# Patient Record
Sex: Male | Born: 2000 | Hispanic: Yes | Marital: Single | Smoking: Never smoker
Health system: Southern US, Community
[De-identification: ages and names within clinical notes are randomized; demographics above are authoritative.]

## PROBLEM LIST (undated history)

## (undated) DIAGNOSIS — K259 Gastric ulcer, unspecified as acute or chronic, without hemorrhage or perforation: Secondary | ICD-10-CM

## (undated) DIAGNOSIS — E7800 Pure hypercholesterolemia, unspecified: Secondary | ICD-10-CM

---
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
## 2021-06-11 ENCOUNTER — Emergency Department
Admission: EM | Admit: 2021-06-11 | Discharge: 2021-06-11 | Disposition: A | Discharge status: Home / Self Care | Payer: Medicaid Other | Attending: Emergency Medicine | Admitting: Emergency Medicine

## 2021-06-11 ENCOUNTER — Encounter: Payer: Self-pay | Admitting: Emergency Medicine

## 2021-06-11 ENCOUNTER — Other Ambulatory Visit: Payer: Self-pay

## 2021-06-11 DIAGNOSIS — R7401 Elevation of levels of liver transaminase levels: Secondary | ICD-10-CM | POA: Diagnosis not present

## 2021-06-11 DIAGNOSIS — J029 Acute pharyngitis, unspecified: Secondary | ICD-10-CM | POA: Insufficient documentation

## 2021-06-11 DIAGNOSIS — D72829 Elevated white blood cell count, unspecified: Secondary | ICD-10-CM | POA: Diagnosis not present

## 2021-06-11 DIAGNOSIS — K920 Hematemesis: Secondary | ICD-10-CM | POA: Insufficient documentation

## 2021-06-11 DIAGNOSIS — R112 Nausea with vomiting, unspecified: Secondary | ICD-10-CM

## 2021-06-11 DIAGNOSIS — R197 Diarrhea, unspecified: Secondary | ICD-10-CM | POA: Diagnosis not present

## 2021-06-11 DIAGNOSIS — I10 Essential (primary) hypertension: Secondary | ICD-10-CM | POA: Diagnosis not present

## 2021-06-11 LAB — CBC WITH DIFFERENTIAL/PLATELET
Abs Immature Granulocytes: 0.03 10*3/uL (ref 0.00–0.07)
Basophils Absolute: 0 10*3/uL (ref 0.0–0.1)
Basophils Relative: 0 %
Eosinophils Absolute: 0.1 10*3/uL (ref 0.0–0.5)
Eosinophils Relative: 1 %
HCT: 47.5 % (ref 39.0–52.0)
Hemoglobin: 16.2 g/dL (ref 13.0–17.0)
Immature Granulocytes: 0 %
Lymphocytes Relative: 29 %
Lymphs Abs: 3.1 10*3/uL (ref 0.7–4.0)
MCH: 28.7 pg (ref 26.0–34.0)
MCHC: 34.1 g/dL (ref 30.0–36.0)
MCV: 84.2 fL (ref 80.0–100.0)
Monocytes Absolute: 0.5 10*3/uL (ref 0.1–1.0)
Monocytes Relative: 5 %
Neutro Abs: 7.1 10*3/uL (ref 1.7–7.7)
Neutrophils Relative %: 65 %
Platelets: 185 10*3/uL (ref 150–400)
RBC: 5.64 MIL/uL (ref 4.22–5.81)
RDW: 12.2 % (ref 11.5–15.5)
WBC: 10.9 10*3/uL — ABNORMAL HIGH (ref 4.0–10.5)
nRBC: 0 % (ref 0.0–0.2)

## 2021-06-11 LAB — COMPREHENSIVE METABOLIC PANEL
ALT: 151 U/L — ABNORMAL HIGH (ref 0–44)
AST: 70 U/L — ABNORMAL HIGH (ref 15–41)
Albumin: 4.4 g/dL (ref 3.5–5.0)
Alkaline Phosphatase: 92 U/L (ref 38–126)
Anion gap: 10 (ref 5–15)
BUN: 17 mg/dL (ref 6–20)
CO2: 23 mmol/L (ref 22–32)
Calcium: 9.6 mg/dL (ref 8.9–10.3)
Chloride: 104 mmol/L (ref 98–111)
Creatinine, Ser: 0.72 mg/dL (ref 0.61–1.24)
GFR, Estimated: >60 mL/min (ref >60)
Glucose, Bld: 123 mg/dL — ABNORMAL HIGH (ref 70–99)
Potassium: 4.4 mmol/L (ref 3.5–5.1)
Sodium: 137 mmol/L (ref 135–145)
Total Bilirubin: 0.9 mg/dL (ref 0.3–1.2)
Total Protein: 8.1 g/dL (ref 6.5–8.1)

## 2021-06-11 LAB — URINALYSIS, COMPLETE (UACMP) WITH MICROSCOPIC
Bacteria, UA: NONE SEEN
Bilirubin Urine: NEGATIVE
Glucose, UA: NEGATIVE mg/dL
Hgb urine dipstick: NEGATIVE
Ketones, ur: NEGATIVE mg/dL
Leukocytes,Ua: NEGATIVE
Nitrite: NEGATIVE
Protein, ur: NEGATIVE mg/dL
Specific Gravity, Urine: 1.018 (ref 1.005–1.030)
pH: 6 (ref 5.0–8.0)

## 2021-06-11 LAB — LIPASE, BLOOD: Lipase: 30 U/L (ref 11–51)

## 2021-06-11 MED ORDER — LACTATED RINGERS IV BOLUS
1000.0000 mL | Freq: Once | INTRAVENOUS | Status: AC
  Administered 2021-06-11: 1000 mL via INTRAVENOUS

## 2021-06-11 MED ORDER — PANTOPRAZOLE SODIUM 40 MG PO TBEC: 40.0000 mg | DELAYED_RELEASE_TABLET | Freq: Every day | ORAL | 0 refills | Status: DC

## 2021-06-11 MED ORDER — PANTOPRAZOLE SODIUM 40 MG IV SOLR
40.0000 mg | Freq: Once | INTRAVENOUS | Status: AC
  Administered 2021-06-11: 40 mg via INTRAVENOUS
  Filled 2021-06-11: qty 10

## 2021-06-11 NOTE — ED Provider Notes (Signed)
? ?[facility] ?Provider Note ? ? ? Event Date/Time  ? First MD Initiated Contact with Patient 06/11/21 [phone]   ?  (approximate) ? ? ?History  ? ?Hematemesis ? ? ?HPI ? ?Macy Rodgerick Gilliand is a 21 y.o. male with a past medical history of obesity and binge drinking having had about 10 beers in the last 24 hours who presents for assessment of some nausea and vomiting as well as hematemesis and some right-sided flank pain and diarrhea starting last night.  Patient states his nausea and abdominal pain have resolved.  He has never had issues like this before.  He denies any blood thinner use or NSAID use.  No history of kidney stones.  He has not had any chest pain, cough, shortness of breath, headache, earache, urinary symptoms or extremity pain.  Does endorse mild sore throat that started after the vomiting.  No other acute concerns at this time. ? ?  ? ? ?Physical Exam  ?Triage Vital Signs: ?ED Triage Vitals  ?Enc Vitals Group  ?   BP --   ?   Pulse --   ?   Resp --   ?   Temp --   ?   Temp src --   ?   SpO2 --   ?   Weight 06/11/21 0816 231 lb 14.8 oz (105.2 kg)  ?   Height 06/11/21 0816 5\' 7"  (1.702 m)  ?   Head Circumference --   ?   Peak Flow --   ?   Pain Score 06/11/21 0815 4  ?   Pain Loc --   ?   Pain Edu? --   ?   Excl. in GC? --   ? ? ?Most recent vital signs: ?Vitals:  ? 06/11/21 0815 06/11/21 0946  ?BP: (!) 155/88 (!) 155/88  ?Pulse: 98 98  ?Resp: 20 20  ?Temp: 98.6 ?F (37 ?C) 98.6 ?F (37 ?C)  ?SpO2: 96% 96%  ? ? ?General: Awake, no distress.  ?CV:  Good peripheral perfusion.  2+ radial pulses. ?Resp:  Normal effort.  Clear bilaterally. ?Abd:  No distention.  Very mild tenderness in the right upper quadrant without any other significant tenderness. ? ?Oropharynx is some mild posterior oropharyngeal erythema without any uvular deviation, exudates, tonsillar enlargement or other abnormalities. ? ? ?ED Results / Procedures / Treatments  ?Labs ?(all labs ordered are listed, but only  abnormal results are displayed) ?Labs Reviewed  ?CBC WITH DIFFERENTIAL/PLATELET - Abnormal; Notable for the following components:  ?    Result Value  ? WBC 10.9 (*)   ? All other components within normal limits  ?COMPREHENSIVE METABOLIC PANEL - Abnormal; Notable for the following components:  ? Glucose, Bld 123 (*)   ? AST 70 (*)   ? ALT 151 (*)   ? All other components within normal limits  ?URINALYSIS, COMPLETE (UACMP) WITH MICROSCOPIC - Abnormal; Notable for the following components:  ? Color, Urine YELLOW (*)   ? APPearance CLEAR (*)   ? All other components within normal limits  ?LIPASE, BLOOD  ? ? ? ?EKG ? ? ? ?RADIOLOGY ? ? ? ?PROCEDURES: ? ?Critical Care performed: No ? ?Procedures ? ? ? ?MEDICATIONS ORDERED IN ED: ?Medications  ?pantoprazole (PROTONIX) injection 40 mg (40 mg Intravenous Given 06/11/21 0910)  ?lactated ringers bolus 1,000 mL (1,000 mLs Intravenous New Bag/Given 06/11/21 0910)  ? ? ? ?IMPRESSION / MDM / ASSESSMENT AND PLAN / ED COURSE  ?I reviewed the  triage vital signs and the nursing notes. ?             ?               ? ?Differential diagnosis includes, but is not limited to alcoholic gastritis, Mallory-Weiss tear, pancreatitis, dehydration or metabolic derangements with a lower suspicion at this time based on absence of any pain on presentation and overall well appearance and stable vitals for Boerhaave syndrome and have a lower suspicion for diverticulitis, appendicitis or pyelonephritis.  UA is unremarkable including no blood and have a lower suspicion for kidney stone.  CBC shows WBC count of 10.9 without evidence of anemia and normal platelets.  CMP is remarkable for an AST of 70 and ALT of 151.  I suspect this is possibly related to his alcohol use although somewhat atypical pattern.  Lower suspicion for other toxicity at this time.  No evidence of cholestasis.  Lipase not suggestive of acute pancreatitis. ? ?Suspect likely component of acute alcoholic gastritis possibly some  Mallory-Weiss tear and reflux.  I suspect patient sore throat is from his vomiting.  He is no subsequent vomiting and stable vitals over 2 hours emergency room.  He is continuing to deny any pain or nausea on my reassessment.  At this point think he is stable for discharge with outpatient follow-up.  We will have patient see PCP to have his liver enzymes rechecked.  Counseled on dangers of binge drinking.  Also advised to have blood pressure rechecked.  Discharged in stable condition.  Strict and precautions advised and discussed.  Rx for Protonix written. ? ?  ? ? ?FINAL CLINICAL IMPRESSION(S) / ED DIAGNOSES  ? ?Final diagnoses:  ?Nausea vomiting and diarrhea  ?Hematemesis, unspecified whether nausea present  ?Transaminitis  ?Hypertension, unspecified type  ? ? ? ?Rx / DC Orders  ? ?ED Discharge Orders   ? ?      Ordered  ?  pantoprazole (PROTONIX) 40 MG tablet  Daily       ? 06/11/21 1013  ? ?  ?  ? ?  ? ? ? ?Note:  This document was prepared using Dragon voice recognition software and may include unintentional dictation errors. ?  ?Gilles Chiquito, MD ?06/11/21 1013 ? ?

## 2021-06-11 NOTE — ED Notes (Signed)
Pt to ED states this morning was coughing up quarter size clots, dark red color. States was having pain RLQ this morning when was vomiting. States vomited 3 times. Denies blood in stool. Endorses diarrhea 6-7 occurrences this morning. Denies known fevers but states felt hot this morning and was sweating. Denies chest pain. ?

## 2021-06-11 NOTE — Discharge Instructions (Addendum)
Blood pressure was slightly elevated today.  Please have this rechecked whenever [place] for enzymes recheck. ?

## 2021-06-11 NOTE — ED Triage Notes (Signed)
C/O vomiting blood and right flank pain x 30  minutes. ? ?AAOx3.  Skin warm and dry. NAD ?

## 2022-04-11 ENCOUNTER — Other Ambulatory Visit (HOSPITAL_BASED_OUTPATIENT_CLINIC_OR_DEPARTMENT_OTHER): Payer: Self-pay | Admitting: Internal Medicine

## 2022-04-11 DIAGNOSIS — Z0000 Encounter for general adult medical examination without abnormal findings: Secondary | ICD-10-CM

## 2022-04-11 DIAGNOSIS — R1084 Generalized abdominal pain: Secondary | ICD-10-CM

## 2022-04-19 ENCOUNTER — Other Ambulatory Visit: Payer: Self-pay

## 2022-04-19 NOTE — Progress Notes (Signed)
Pt cleared pre-employment UDS. HR notified. Burna Sis

## 2022-04-22 ENCOUNTER — Ambulatory Visit
Admission: RE | Admit: 2022-04-22 | Discharge: 2022-04-22 | Disposition: A | Discharge status: Home / Self Care | Payer: Medicaid Other | Source: Ambulatory Visit | Attending: Internal Medicine | Admitting: Internal Medicine

## 2022-04-22 DIAGNOSIS — R1084 Generalized abdominal pain: Secondary | ICD-10-CM | POA: Insufficient documentation

## 2022-04-22 DIAGNOSIS — Z0000 Encounter for general adult medical examination without abnormal findings: Secondary | ICD-10-CM | POA: Insufficient documentation

## 2022-06-07 ENCOUNTER — Ambulatory Visit: Payer: Self-pay

## 2022-06-07 ENCOUNTER — Telehealth: Payer: Self-pay

## 2022-06-07 DIAGNOSIS — Z0000 Encounter for general adult medical examination without abnormal findings: Secondary | ICD-10-CM

## 2022-06-07 LAB — POCT URINALYSIS DIPSTICK
Bilirubin, UA: NEGATIVE
Blood, UA: NEGATIVE
Glucose, UA: NEGATIVE
Ketones, UA: NEGATIVE
Leukocytes, UA: NEGATIVE
Nitrite, UA: NEGATIVE
Protein, UA: NEGATIVE
Spec Grav, UA: 1.025 (ref 1.010–1.025)
Urobilinogen, UA: 0.2 E.U./dL
pH, UA: 6 (ref 5.0–8.0)

## 2022-06-07 NOTE — Telephone Encounter (Signed)
Sent email to [employer] supervisor requesting he call the clinic at [phone] to schedule a baseline hearing screen & discuss immunization status for Hepatitis B & Tetanus (job related).  Shane Mckay contacted [place] about immunizations & baseline hearing screen.  Decided to complete his annual physical through the clinic.  (Job R/T - insurance premiums)  Labs, Tdap & Hearing Screen - 06/07/2022 (completed) Physical with [name], PA-C scheduled for 06/13/2022   AMD

## 2022-06-07 NOTE — Progress Notes (Signed)
Pt presents today to complete lab portion of physical, Pt agreed to receive tdap and hep b titer.

## 2022-06-08 LAB — CMP12+LP+TP+TSH+6AC+CBC/D/PLT
ALT: 55 IU/L — ABNORMAL HIGH (ref 0–44)
AST: 27 IU/L (ref 0–40)
Albumin/Globulin Ratio: 1.5 (ref 1.2–2.2)
Albumin: 4.5 g/dL (ref 4.3–5.2)
Alkaline Phosphatase: 95 IU/L (ref 51–125)
BUN/Creatinine Ratio: 21 — ABNORMAL HIGH (ref 9–20)
BUN: 15 mg/dL (ref 6–20)
Basophils Absolute: 0.1 10*3/uL (ref 0.0–0.2)
Basos: 1 %
Bilirubin Total: 0.3 mg/dL (ref 0.0–1.2)
Calcium: 9.8 mg/dL (ref 8.7–10.2)
Chloride: 103 mmol/L (ref 96–106)
Chol/HDL Ratio: 8.1 ratio — ABNORMAL HIGH (ref 0.0–5.0)
Cholesterol, Total: 284 mg/dL — ABNORMAL HIGH (ref 100–199)
Creatinine, Ser: 0.71 mg/dL — ABNORMAL LOW (ref 0.76–1.27)
EOS (ABSOLUTE): 0.1 10*3/uL (ref 0.0–0.4)
Eos: 1 %
Estimated CHD Risk: 1.7 times avg. — ABNORMAL HIGH (ref 0.0–1.0)
Free Thyroxine Index: 2.3 (ref 1.2–4.9)
GGT: 29 IU/L (ref 0–65)
Globulin, Total: 3 g/dL (ref 1.5–4.5)
Glucose: 108 mg/dL — ABNORMAL HIGH (ref 70–99)
HDL: 35 mg/dL — ABNORMAL LOW (ref >39)
Hematocrit: 46.1 % (ref 37.5–51.0)
Hemoglobin: 15.8 g/dL (ref 13.0–17.7)
Immature Grans (Abs): 0 10*3/uL (ref 0.0–0.1)
Immature Granulocytes: 0 %
Iron: 74 ug/dL (ref 38–169)
LDH: 197 IU/L (ref 121–224)
LDL Chol Calc (NIH): 159 mg/dL — ABNORMAL HIGH (ref 0–99)
Lymphocytes Absolute: 4.1 10*3/uL — ABNORMAL HIGH (ref 0.7–3.1)
Lymphs: 48 %
MCH: 29.6 pg (ref 26.6–33.0)
MCHC: 34.3 g/dL (ref 31.5–35.7)
MCV: 87 fL (ref 79–97)
Monocytes Absolute: 0.3 10*3/uL (ref 0.1–0.9)
Monocytes: 4 %
Neutrophils Absolute: 4 10*3/uL (ref 1.4–7.0)
Neutrophils: 46 %
Phosphorus: 3.3 mg/dL (ref 2.8–4.1)
Platelets: 162 10*3/uL (ref 150–450)
Potassium: 4.5 mmol/L (ref 3.5–5.2)
RBC: 5.33 x10E6/uL (ref 4.14–5.80)
RDW: 12.7 % (ref 11.6–15.4)
Sodium: 142 mmol/L (ref 134–144)
T3 Uptake Ratio: 30 % (ref 24–39)
T4, Total: 7.6 ug/dL (ref 4.5–12.0)
TSH: 1.84 u[IU]/mL (ref 0.450–4.500)
Total Protein: 7.5 g/dL (ref 6.0–8.5)
Triglycerides: 464 mg/dL — ABNORMAL HIGH (ref 0–149)
Uric Acid: 7.3 mg/dL (ref 3.8–8.4)
VLDL Cholesterol Cal: 90 mg/dL — ABNORMAL HIGH (ref 5–40)
WBC: 8.6 10*3/uL (ref 3.4–10.8)
eGFR: 135 mL/min/{1.73_m2} (ref >59)

## 2022-06-08 LAB — HEPATITIS B SURFACE ANTIBODY,QUALITATIVE: Hep B Surface Ab, Qual: NONREACTIVE

## 2022-06-13 ENCOUNTER — Encounter: Payer: Self-pay | Admitting: Physician Assistant

## 2022-06-13 ENCOUNTER — Ambulatory Visit: Payer: Self-pay | Admitting: Physician Assistant

## 2022-06-13 VITALS — BP 129/77 | HR 96 | Temp 98.0°F | Resp 14 | Ht 70.0 in | Wt 291.0 lb

## 2022-06-13 DIAGNOSIS — Z0000 Encounter for general adult medical examination without abnormal findings: Secondary | ICD-10-CM

## 2022-06-13 DIAGNOSIS — Z23 Encounter for immunization: Secondary | ICD-10-CM

## 2022-06-13 DIAGNOSIS — E782 Mixed hyperlipidemia: Secondary | ICD-10-CM

## 2022-06-13 MED ORDER — ROSUVASTATIN CALCIUM 40 MG PO TABS: 40.0000 mg | ORAL_TABLET | Freq: Every day | ORAL | 3 refills | Status: AC

## 2022-06-13 NOTE — Progress Notes (Signed)
Pt presents today to complete physical, Pt denies any issues or concerns at this time/CL,RMA 

## 2022-06-13 NOTE — Progress Notes (Signed)
[facility] ____________________________________________   None    (approximate)  I have reviewed the triage vital signs and the nursing notes.   HISTORY  Chief Complaint Annual Exam  HPI Shane Mckay is a 22 y.o. male patient presents for physical exam.  Patient is new to this facility.  Patient state he is aware has been diagnosed with fatty liver.        No past medical history on file.  There are no problems to display for this patient.   No past surgical history on file.  Prior to Admission medications   Not on File    Allergies Patient has no known allergies.  No family history on file.  Social History Social History   Tobacco Use   Smoking status: Never   Smokeless tobacco: Never   Tobacco comments:    Smoked x1 (less than one cigarette)  Substance Use Topics   Alcohol use: Yes   Drug use: Never    Review of Systems Constitutional: No fever/chills Eyes: No visual changes. ENT: No sore throat. Cardiovascular: Denies chest pain. Respiratory: Denies shortness of breath. Gastrointestinal: No abdominal pain.  No nausea, no vomiting.  No diarrhea.  No constipation. Genitourinary: Negative for dysuria. Musculoskeletal: Negative for back pain. Skin: Negative for rash. Neurological: Negative for headaches, focal weakness or numbness.  ____________________________________________   PHYSICAL EXAM:  VITAL SIGNS: BP is 129/77, pulse 96, respiration 14, patient is 98% O2 sat on room air.  Patient weighs 291 pounds and BMI is 41.75. Constitutional: Alert and oriented. Well appearing and in no acute distress. Eyes: Conjunctivae are normal. PERRL. EOMI. Head: Atraumatic. Nose: No congestion/rhinnorhea. Mouth/Throat: Mucous membranes are moist.  Oropharynx non-erythematous. Neck: No stridor.  No cervical spine tenderness to palpation. Hematological/Lymphatic/Immunilogical: No cervical  lymphadenopathy. Cardiovascular: Normal rate, regular rhythm. Grossly normal heart sounds.  Good peripheral circulation. Respiratory: Normal respiratory effort.  No retractions. Lungs CTAB. Gastrointestinal: Soft and nontender.  Distention secondary to body habitus . No abdominal bruits. No CVA tenderness. Genitourinary: Deferred Musculoskeletal: No lower extremity tenderness nor edema.  No joint effusions. Neurologic:  Normal speech and language. No gross focal neurologic deficits are appreciated. No gait instability. Skin:  Skin is warm, dry and intact. No rash noted. Psychiatric: Mood and affect are normal. Speech and behavior are normal.  ____________________________________________   LABS  Ref Range & Units 6 d ago 1 yr ago  Color, UA yellow   Clarity, UA clear   Glucose, UA Negative Negative   Bilirubin, UA neg   Ketones, UA neg   Spec Grav, UA 1.010 - 1.025 1.025   Blood, UA neg   pH, UA 5.0 - 8.0 6.0   Protein, UA Negative Negative   Urobilinogen, UA 0.2 or 1.0 E.U./dL 0.2   Nitrite, UA neg   Leukocytes, UA Negative Negative   Appearance dark CLEAR Abnormal  R  Odor    Resulting Agency  [facility]           Component 6 d ago  Hep B Surface Ab, Qual Non Reactive  Comment:               Non Reactive: Inconsistent with immunity,                             less than 10 mIU/mL               Reactive:  Consistent with immunity,                             greater than 9.9 mIU/mL  Resulting Agency [id number]            Component Ref Range & Units 6 d ago (06/07/22) 1 yr ago (06/11/21) 1 yr ago (06/11/21)  Glucose 70 - 99 mg/dL 108 High   123 High  CM  Uric Acid 3.8 - 8.4 mg/dL 7.3    Comment:            Therapeutic target for gout patients: <6.0  BUN 6 - 20 mg/dL 15  17  Creatinine, Ser 0.76 - 1.27 mg/dL 0.71 Low   0.72 R  eGFR >59 mL/min/1.73 135    BUN/Creatinine Ratio 9 - 20 21 High     Sodium 134 - 144 mmol/L 142  137 R  Potassium 3.5 -  5.2 mmol/L 4.5  4.4 R, CM  Chloride 96 - 106 mmol/L 103  104 R  Calcium 8.7 - 10.2 mg/dL 9.8  9.6 R  Phosphorus 2.8 - 4.1 mg/dL 3.3    Total Protein 6.0 - 8.5 g/dL 7.5  8.1 R  Albumin 4.3 - 5.2 g/dL 4.5  4.4 R  Globulin, Total 1.5 - 4.5 g/dL 3.0    Albumin/Globulin Ratio 1.2 - 2.2 1.5    Bilirubin Total 0.0 - 1.2 mg/dL 0.3  0.9 R  Alkaline Phosphatase 51 - 125 IU/L 95  92 R  LDH 121 - 224 IU/L 197    AST 0 - 40 IU/L 27  70 High  R  ALT 0 - 44 IU/L 55 High   151 High  R  GGT 0 - 65 IU/L 29    Iron 38 - 169 ug/dL 74    Cholesterol, Total 100 - 199 mg/dL 284 High     Triglycerides 0 - 149 mg/dL 464 High     HDL >39 mg/dL 35 Low     VLDL Cholesterol Cal 5 - 40 mg/dL 90 High     LDL Chol Calc (NIH) 0 - 99 mg/dL 159 High     Chol/HDL Ratio 0.0 - 5.0 ratio 8.1 High     Comment:                                   T. Chol/HDL Ratio                                             Men  Women                               1/2 Avg.Risk  3.4    3.3                                   Avg.Risk  5.0    4.4                                2X Avg.Risk  9.6    7.1  3X Avg.Risk 23.4   11.0  Estimated CHD Risk 0.0 - 1.0 times avg. 1.7 High     Comment: The CHD Risk is based on the T. Chol/HDL ratio. Other factors affect CHD Risk such as hypertension, smoking, diabetes, severe obesity, and family history of premature CHD.  TSH 0.450 - 4.500 uIU/mL 1.840    T4, Total 4.5 - 12.0 ug/dL 7.6    T3 Uptake Ratio 24 - 39 % 30    Free Thyroxine Index 1.2 - 4.9 2.3    WBC 3.4 - 10.8 x10E3/uL 8.6 10.9 High  R   RBC 4.14 - 5.80 x10E6/uL 5.33 5.64 R   Hemoglobin 13.0 - 17.7 g/dL 15.8 16.2 R   Hematocrit 37.5 - 51.0 % 46.1 47.5 R   MCV 79 - 97 fL 87 84.2 R   MCH 26.6 - 33.0 pg 29.6 28.7 R   MCHC 31.5 - 35.7 g/dL 34.3 34.1 R   RDW 11.6 - 15.4 % 12.7 12.2 R   Platelets 150 - 450 x10E3/uL 162 185 R   Neutrophils Not Estab. % 46 65 R   Lymphs Not Estab.  % 48    Monocytes Not Estab. % 4    Eos Not Estab. % 1    Basos Not Estab. % 1    Neutrophils Absolute 1.4 - 7.0 x10E3/uL 4.0 7.1 R   Lymphocytes Absolute 0.7 - 3.1 x10E3/uL 4.1 High  3.1 R   Monocytes Absolute 0.1 - 0.9 x10E3/uL 0.3    EOS (ABSOLUTE) 0.0 - 0.4 x10E3/uL 0.1    Basophils Absolute 0.0 - 0.2 x10E3/uL 0.1 0.0 R   Immature Granulocytes Not Estab. % 0 0 R   I         ____________________________________________    ____________________________________________   INITIAL IMPRESSION / ASSESSMENT AND PLAN   As part of my medical decision making, I reviewed the following data within the electronic MEDICAL RECORD NUMBER       No acute findings on physical exam except for morbid obesity.  Discussed lab results with patient showing a cholesterol of 284, triglycerides 464, and HDL 35.  Patient is a minimal to lifestyle changes, diet, and starting a statin.  Patient started on Crestor 40 mg daily and follow-up fasting lipids in 3 months.     ____________________________________________   FINAL CLINICAL IMPRESSION Mixed hyperlipidemia   ED Discharge Orders     None        Note:  This document was prepared using Dragon voice recognition software and may include unintentional dictation errors.

## 2022-06-17 DIAGNOSIS — M25511 Pain in right shoulder: Secondary | ICD-10-CM | POA: Diagnosis not present

## 2022-06-17 DIAGNOSIS — M25521 Pain in right elbow: Secondary | ICD-10-CM | POA: Diagnosis not present

## 2022-06-19 ENCOUNTER — Ambulatory Visit: Payer: Self-pay | Admitting: Physician Assistant

## 2022-06-19 ENCOUNTER — Other Ambulatory Visit: Payer: Self-pay

## 2022-06-19 DIAGNOSIS — Z1152 Encounter for screening for COVID-19: Secondary | ICD-10-CM

## 2022-06-19 LAB — POCT INFLUENZA A/B
Influenza A, POC: NEGATIVE
Influenza B, POC: NEGATIVE

## 2022-06-19 LAB — POC COVID19 BINAXNOW: SARS Coronavirus 2 Ag: NEGATIVE

## 2022-06-19 NOTE — Progress Notes (Signed)
Pt presents today with cough, fever and congestion since Monday.

## 2022-06-19 NOTE — Progress Notes (Signed)
S/Sx Monday Cough Congestion Fever yesterday of 54 F  Has been taking "allergy medicine that starts with a z"  Informed Covid & flu test results are negative Advised to try Allegra D bid for symptoms Advised needs to be fever free x24 hours w/out use of acetaminophen or ibuprofen to return to work.  AMD

## 2022-07-15 DIAGNOSIS — K2900 Acute gastritis without bleeding: Secondary | ICD-10-CM | POA: Diagnosis not present

## 2022-07-15 DIAGNOSIS — E782 Mixed hyperlipidemia: Secondary | ICD-10-CM | POA: Diagnosis not present

## 2022-07-31 ENCOUNTER — Other Ambulatory Visit: Payer: Self-pay

## 2022-07-31 ENCOUNTER — Emergency Department
Admission: EM | Admit: 2022-07-31 | Discharge: 2022-07-31 | Disposition: A | Discharge status: Home / Self Care | Payer: 59 | Attending: Emergency Medicine | Admitting: Emergency Medicine

## 2022-07-31 ENCOUNTER — Encounter: Payer: Self-pay | Admitting: Emergency Medicine

## 2022-07-31 DIAGNOSIS — S91212A Laceration without foreign body of left great toe with damage to nail, initial encounter: Secondary | ICD-10-CM | POA: Insufficient documentation

## 2022-07-31 DIAGNOSIS — S91112A Laceration without foreign body of left great toe without damage to nail, initial encounter: Secondary | ICD-10-CM

## 2022-07-31 DIAGNOSIS — S99922A Unspecified injury of left foot, initial encounter: Secondary | ICD-10-CM | POA: Diagnosis present

## 2022-07-31 DIAGNOSIS — Y92002 Bathroom of unspecified non-institutional (private) residence single-family (private) house as the place of occurrence of the external cause: Secondary | ICD-10-CM | POA: Diagnosis not present

## 2022-07-31 DIAGNOSIS — W268XXA Contact with other sharp object(s), not elsewhere classified, initial encounter: Secondary | ICD-10-CM | POA: Diagnosis not present

## 2022-07-31 NOTE — Discharge Instructions (Addendum)
You can take the steri strips off when they start to fall off. Please keep area clean and dry.

## 2022-07-31 NOTE — ED Triage Notes (Signed)
Pt via POV from home. Pt states he slipped in the shower and has a laceration to the L great toe from a razor. Bleeding controlled. Pt has had tetanus within 5 years. Pt is A&Ox4 and NAD, ambulatory to triage.

## 2022-07-31 NOTE — ED Provider Notes (Signed)
[facility] Provider Note    Event Date/Time   First MD Initiated Contact with Patient 07/31/22 [phone]     (approximate)   History   Laceration   HPI  Shane Mckay is a 22 y.o. male with no prior medical history who presents to the ER today for evaluation of a foot laceration on the great toe.  He slipped in the shower this morning and believes he cut it on a razor blade.  He has had a tetanus shot in the last 5 years.  He reports some mild pain and some bleeding from the wound.   Physical Exam   Triage Vital Signs: ED Triage Vitals  Enc Vitals Group     BP 07/31/22 0806 (!) 153/84     Pulse Rate 07/31/22 0806 81     Resp 07/31/22 0806 18     Temp 07/31/22 0806 98.8 F (37.1 C)     Temp Source 07/31/22 0806 Oral     SpO2 07/31/22 0806 98 %     Weight 07/31/22 0805 284 lb (128.8 kg)     Height 07/31/22 0805 5\' 10"  (1.778 m)     Head Circumference --      Peak Flow --      Pain Score 07/31/22 0805 8     Pain Loc --      Pain Edu? --      Excl. in GC? --     Most recent vital signs: Vitals:   07/31/22 0806 07/31/22 0922  BP: (!) 153/84   Pulse: 81   Resp: 18 20  Temp: 98.8 F (37.1 C)   SpO2: 98%     General: Awake, no distress.  CV:  Good peripheral perfusion.  Resp:  Normal effort.  Abd:  No distention.  R Big Toe: 2.5 cm laceration distal to toenail, running down the front of the toe, full ROM in toe flexion and extension.  Capillary refill appropriate.    ED Results / Procedures / Treatments   Labs (all labs ordered are listed, but only abnormal results are displayed) Labs Reviewed - No data to display   PROCEDURES:  Critical Care performed: No  ..Laceration Repair  Date/Time: 07/31/2022 9:09 AM  Performed by: Cruz Condon, PA-C Authorized by: Cruz Condon, PA-C   Consent:    Consent obtained:  Verbal   Consent given by:  Patient   Risks discussed:  Infection and pain   Alternatives discussed:  Offered to suture it closed, but patient preferred to use skin glue. Anesthesia:    Anesthesia method:  None Laceration details:    Location:  Toe   Toe location:  L big toe   Length (cm):  2.5   Depth (mm):  2 Treatment:    Area cleansed with:  Saline   Amount of cleaning:  Standard   Irrigation solution:  Sterile saline   Irrigation method:  Syringe Skin repair:    Repair method:  Tissue adhesive and Steri-Strips (skin glue with steri strips on top to prevent rubbing on the glue)   Number of Steri-Strips:  5 Approximation:    Approximation:  Close Repair type:    Repair type:  Simple Post-procedure details:    Dressing:  Open (no dressing)   Procedure completion:  Tolerated well, no immediate complications    MEDICATIONS ORDERED IN ED: Medications - No data to display   IMPRESSION / MDM / ASSESSMENT AND PLAN / ED COURSE  I reviewed the  triage vital signs and the nursing notes.                              Differential diagnosis includes, but is not limited to, laceration, fracture, tendon injury.  Patient's presentation is most consistent with acute, uncomplicated illness.  Patient came to the ER for repair of a laceration on the great toe.  Tendon injury unlikely given that patient has full range of motion.  Patient has had a tetanus shot within the last 5 years so he was not given a booster today.  Wound was cleaned with saline and closed with skin glue without complication.  Steri strips were then applied over the skin glue to prevent rubbing on the glue from his sock or shoe.  Patient given wound care instructions and determined stable for discharge.      FINAL CLINICAL IMPRESSION(S) / ED DIAGNOSES   Final diagnoses:  Laceration of left great toe without foreign body present or damage to nail, initial encounter     Rx / DC Orders   ED Discharge Orders     None        Note:  This document was prepared using Dragon voice recognition software and may  include unintentional dictation errors.   Cruz Condon, PA-C 07/31/22 1521    Trinna Post, MD 07/31/22 [phone]

## 2022-09-11 ENCOUNTER — Other Ambulatory Visit: Payer: Self-pay

## 2022-09-16 DIAGNOSIS — M25519 Pain in unspecified shoulder: Secondary | ICD-10-CM | POA: Diagnosis not present

## 2022-09-16 DIAGNOSIS — R109 Unspecified abdominal pain: Secondary | ICD-10-CM | POA: Diagnosis not present

## 2022-09-16 DIAGNOSIS — K219 Gastro-esophageal reflux disease without esophagitis: Secondary | ICD-10-CM | POA: Diagnosis not present

## 2022-09-16 DIAGNOSIS — R112 Nausea with vomiting, unspecified: Secondary | ICD-10-CM | POA: Diagnosis not present

## 2022-09-16 DIAGNOSIS — K760 Fatty (change of) liver, not elsewhere classified: Secondary | ICD-10-CM | POA: Diagnosis not present

## 2022-09-16 DIAGNOSIS — R300 Dysuria: Secondary | ICD-10-CM | POA: Diagnosis not present

## 2022-09-16 DIAGNOSIS — G8929 Other chronic pain: Secondary | ICD-10-CM | POA: Diagnosis not present

## 2023-08-27 ENCOUNTER — Other Ambulatory Visit: Payer: Self-pay

## 2023-08-27 ENCOUNTER — Emergency Department
Admission: EM | Admit: 2023-08-27 | Discharge: 2023-08-27 | Disposition: A | Discharge status: Home / Self Care | Attending: Emergency Medicine | Admitting: Emergency Medicine

## 2023-08-27 ENCOUNTER — Encounter: Payer: Self-pay | Admitting: Emergency Medicine

## 2023-08-27 DIAGNOSIS — K529 Noninfective gastroenteritis and colitis, unspecified: Secondary | ICD-10-CM | POA: Diagnosis not present

## 2023-08-27 DIAGNOSIS — R42 Dizziness and giddiness: Secondary | ICD-10-CM | POA: Diagnosis not present

## 2023-08-27 DIAGNOSIS — D72829 Elevated white blood cell count, unspecified: Secondary | ICD-10-CM | POA: Diagnosis not present

## 2023-08-27 HISTORY — DX: Pure hypercholesterolemia, unspecified: E78.00

## 2023-08-27 HISTORY — DX: Gastric ulcer, unspecified as acute or chronic, without hemorrhage or perforation: K25.9

## 2023-08-27 LAB — URINALYSIS, ROUTINE W REFLEX MICROSCOPIC
Bacteria, UA: NONE SEEN
Bilirubin Urine: NEGATIVE
Glucose, UA: NEGATIVE mg/dL
Hgb urine dipstick: NEGATIVE
Ketones, ur: 5 mg/dL — AB
Leukocytes,Ua: NEGATIVE
Nitrite: NEGATIVE
Protein, ur: 30 mg/dL — AB
RBC / HPF: 0 RBC/hpf (ref 0–5)
Specific Gravity, Urine: 1.021 (ref 1.005–1.030)
pH: 5 (ref 5.0–8.0)

## 2023-08-27 LAB — COMPREHENSIVE METABOLIC PANEL WITH GFR
ALT: 95 U/L — ABNORMAL HIGH (ref 0–44)
AST: 46 U/L — ABNORMAL HIGH (ref 15–41)
Albumin: 4.8 g/dL (ref 3.5–5.0)
Alkaline Phosphatase: 61 U/L (ref 38–126)
Anion gap: 12 (ref 5–15)
BUN: 14 mg/dL (ref 6–20)
CO2: 21 mmol/L — ABNORMAL LOW (ref 22–32)
Calcium: 9.8 mg/dL (ref 8.9–10.3)
Chloride: 102 mmol/L (ref 98–111)
Creatinine, Ser: 1.03 mg/dL (ref 0.61–1.24)
GFR, Estimated: >60 mL/min (ref >60)
Glucose, Bld: 99 mg/dL (ref 70–99)
Potassium: 3.6 mmol/L (ref 3.5–5.1)
Sodium: 135 mmol/L (ref 135–145)
Total Bilirubin: 1.5 mg/dL — ABNORMAL HIGH (ref 0.0–1.2)
Total Protein: 8.4 g/dL — ABNORMAL HIGH (ref 6.5–8.1)

## 2023-08-27 LAB — CBC
HCT: 45 % (ref 39.0–52.0)
Hemoglobin: 15.7 g/dL (ref 13.0–17.0)
MCH: 29.1 pg (ref 26.0–34.0)
MCHC: 34.9 g/dL (ref 30.0–36.0)
MCV: 83.3 fL (ref 80.0–100.0)
Platelets: 195 10*3/uL (ref 150–400)
RBC: 5.4 MIL/uL (ref 4.22–5.81)
RDW: 11.9 % (ref 11.5–15.5)
WBC: 11 10*3/uL — ABNORMAL HIGH (ref 4.0–10.5)
nRBC: 0 % (ref 0.0–0.2)

## 2023-08-27 LAB — LIPASE, BLOOD: Lipase: 31 U/L (ref 11–51)

## 2023-08-27 MED ORDER — ONDANSETRON 4 MG PO TBDP: 4.0000 mg | ORAL_TABLET | Freq: Three times a day (TID) | ORAL | 0 refills | Status: AC | PRN

## 2023-08-27 MED ORDER — SODIUM CHLORIDE 0.9 % IV SOLN: Freq: Once | INTRAVENOUS | Status: AC

## 2023-08-27 MED ORDER — ONDANSETRON HCL 4 MG/2ML IJ SOLN
4.0000 mg | Freq: Once | INTRAMUSCULAR | Status: AC
  Administered 2023-08-27: 4 mg via INTRAVENOUS
  Filled 2023-08-27: qty 2

## 2023-08-27 NOTE — ED Provider Notes (Signed)
 [facility] Provider Note    Event Date/Time   First MD Initiated Contact with Patient 08/27/23 1026     (approximate)   History   Emesis and Loss of Consciousness   HPI  Webster Patrone is a 23 y.o. male who reports he has been having nausea vomiting diarrhea over the last 3 days, today he went to work in the heat and was doing weed eating and started to feel lightheaded and had a near syncopal episode.  No chest pain or palpitations.  Feels improved now.     Physical Exam   Triage Vital Signs: ED Triage Vitals  Encounter Vitals Group     BP 08/27/23 1010 135/84     Systolic BP Percentile --      Diastolic BP Percentile --      Pulse Rate 08/27/23 1010 89     Resp 08/27/23 1010 18     Temp 08/27/23 1010 98.9 F (37.2 C)     Temp Source 08/27/23 1010 Oral     SpO2 08/27/23 1010 99 %     Weight 08/27/23 1009 125.2 kg (276 lb)     Height 08/27/23 1009 1.778 m (5' 10)     Head Circumference --      Peak Flow --      Pain Score 08/27/23 1009 0     Pain Loc --      Pain Education --      Exclude from Growth Chart --     Most recent vital signs: Vitals:   08/27/23 1010 08/27/23 1117  BP: 135/84 119/67  Pulse: 89 85  Resp: 18 15  Temp: 98.9 F (37.2 C)   SpO2: 99% 100%     General: Awake, no distress.  CV:  Good peripheral perfusion.  Resp:  Normal effort.  Abd:  No distention.  Soft, nontender Other:     ED Results / Procedures / Treatments   Labs (all labs ordered are listed, but only abnormal results are displayed) Labs Reviewed  COMPREHENSIVE METABOLIC PANEL WITH GFR - Abnormal; Notable for the following components:      Result Value   CO2 21 (*)    Total Protein 8.4 (*)    AST 46 (*)    ALT 95 (*)    Total Bilirubin 1.5 (*)    All other components within normal limits  CBC - Abnormal; Notable for the following components:   WBC 11.0 (*)    All other components within normal limits  URINALYSIS, ROUTINE W  REFLEX MICROSCOPIC - Abnormal; Notable for the following components:   Color, Urine AMBER (*)    APPearance HAZY (*)    Ketones, ur 5 (*)    Protein, ur 30 (*)    All other components within normal limits  LIPASE, BLOOD     EKG  ED ECG REPORT I, Bryson Carbine, the attending physician, personally viewed and interpreted this ECG.  Date: 08/27/2023  Rhythm: normal sinus rhythm QRS Axis: normal Intervals: normal ST/T Wave abnormalities: normal Narrative Interpretation: no evidence of acute ischemia    RADIOLOGY     PROCEDURES:  Critical Care performed:   Procedures   MEDICATIONS ORDERED IN ED: Medications  0.9 %  sodium chloride infusion (0 mLs Intravenous Stopped 08/27/23 1309)  ondansetron  (ZOFRAN ) injection 4 mg (4 mg Intravenous Given 08/27/23 1113)     IMPRESSION / MDM / ASSESSMENT AND PLAN / ED COURSE  I reviewed the triage vital signs  and the nursing notes. Patient's presentation is most consistent with acute illness / injury with system symptoms.    Patient p/w n/v/d suspicious for viral gastroenteritis. Likely dehydrated and then went to work in the heat today leading to near syncopal episode. Not c/w arrythmia, no palp, no cp, no sob  Will give IV fluids, check labs and reevaluate  Lab work overall reassuring   Patient feeling better after fluids, he is asymptomatic. No indication for admission at this time, appropriate for d/c           FINAL CLINICAL IMPRESSION(S) / ED DIAGNOSES   Final diagnoses:  Gastroenteritis  Dizziness     Rx / DC Orders   ED Discharge Orders          Ordered    ondansetron  (ZOFRAN -ODT) 4 MG disintegrating tablet  Every 8 hours PRN        08/27/23 1259             Note:  This document was prepared using Dragon voice recognition software and may include unintentional dictation errors.   Bryson Carbine, MD 08/27/23 [phone]

## 2023-08-27 NOTE — ED Notes (Signed)
 Pt stated he barely ate anything and thinks his headache is from not eating.

## 2023-08-27 NOTE — ED Triage Notes (Signed)
 Pt via POV from home. Pt c/o NV for the past 3-4 days. Denies any abd pain. Report syncopal episode that yesterday. Report he has been staying hydrated. Pt is A&Ox4 and NAD, ambulatory to triage.
